# Patient Record
Sex: Female | Born: 1990 | Race: White | Hispanic: No | Marital: Single | Smoking: Never smoker
Health system: Southern US, Community
[De-identification: ages and names within clinical notes are randomized; demographics above are authoritative.]

## PROBLEM LIST (undated history)

## (undated) DIAGNOSIS — G43909 Migraine, unspecified, not intractable, without status migrainosus: Secondary | ICD-10-CM

## (undated) DIAGNOSIS — H9190 Unspecified hearing loss, unspecified ear: Secondary | ICD-10-CM

## (undated) DIAGNOSIS — J45909 Unspecified asthma, uncomplicated: Secondary | ICD-10-CM

## (undated) HISTORY — PX: COCHLEAR IMPLANT: SHX184

## (undated) HISTORY — PX: TONSILLECTOMY: SUR1361

---
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
## 2012-02-28 ENCOUNTER — Encounter (HOSPITAL_BASED_OUTPATIENT_CLINIC_OR_DEPARTMENT_OTHER): Payer: Self-pay | Admitting: *Deleted

## 2012-02-28 ENCOUNTER — Emergency Department (HOSPITAL_BASED_OUTPATIENT_CLINIC_OR_DEPARTMENT_OTHER)
Admission: EM | Admit: 2012-02-28 | Discharge: 2012-02-28 | Disposition: A | Discharge status: Home / Self Care | Payer: BC Managed Care – PPO | Attending: Emergency Medicine | Admitting: Emergency Medicine

## 2012-02-28 DIAGNOSIS — Z79899 Other long term (current) drug therapy: Secondary | ICD-10-CM | POA: Insufficient documentation

## 2012-02-28 DIAGNOSIS — H9190 Unspecified hearing loss, unspecified ear: Secondary | ICD-10-CM | POA: Insufficient documentation

## 2012-02-28 DIAGNOSIS — G43909 Migraine, unspecified, not intractable, without status migrainosus: Secondary | ICD-10-CM | POA: Insufficient documentation

## 2012-02-28 HISTORY — DX: Migraine, unspecified, not intractable, without status migrainosus: G43.909

## 2012-02-28 MED ORDER — METOCLOPRAMIDE HCL 5 MG/ML IJ SOLN
10.0000 mg | Freq: Once | INTRAMUSCULAR | Status: AC
  Administered 2012-02-28: 10 mg via INTRAVENOUS
  Filled 2012-02-28: qty 2

## 2012-02-28 MED ORDER — DIPHENHYDRAMINE HCL 50 MG/ML IJ SOLN
25.0000 mg | Freq: Once | INTRAMUSCULAR | Status: AC
  Administered 2012-02-28: 25 mg via INTRAVENOUS
  Filled 2012-02-28: qty 1

## 2012-02-28 MED ORDER — DEXAMETHASONE SODIUM PHOSPHATE 10 MG/ML IJ SOLN
10.0000 mg | Freq: Once | INTRAMUSCULAR | Status: AC
  Administered 2012-02-28: 10 mg via INTRAVENOUS
  Filled 2012-02-28: qty 1

## 2012-02-28 NOTE — ED Notes (Signed)
Pt. Will receive meds for headache.  No visual distrubance per Pt. And no vomiting today per Pt.   She reports she did have nausea earlier in the week.

## 2012-02-28 NOTE — ED Notes (Signed)
Headache for over a week. Has seen her MD for same and given Relpax and Flexeril without relief.

## 2012-02-28 NOTE — ED Provider Notes (Addendum)
History     CSN: [id number]  Arrival date & time 02/28/12  1306   First MD Initiated Contact with Patient 02/28/12 1328      Chief Complaint  Patient presents with  . Migraine    (Consider location/radiation/quality/duration/timing/severity/associated sxs/prior treatment) Patient is a 21 y.o. female presenting with migraines. The history is provided by the patient.  Migraine This is a recurrent problem. Episode onset: 1 week ago. The problem occurs constantly. The problem has been gradually improving (Saw her Dr. several days ago and was given Relpax and Flexeril which helped her headaches some but it still persists with pain on the right side). Associated symptoms include headaches. Pertinent negatives include no abdominal pain and no shortness of breath. Nothing aggravates the symptoms. Nothing relieves the symptoms. Treatments tried: relpax and flexeril. The treatment provided mild relief.    Past Medical History  Diagnosis Date  . HOH (hard of hearing)   . Migraine     Past Surgical History  Procedure Date  . Tonsillectomy   . Cochlear implant     No family history on file.  History  Substance Use Topics  . Smoking status: Never Smoker   . Smokeless tobacco: Not on file  . Alcohol Use: No    OB History    Grav Para Term Preterm Abortions TAB SAB Ect Mult Living                  Review of Systems  Constitutional: Negative for fever.  Respiratory: Negative for shortness of breath.   Gastrointestinal: Negative for abdominal pain.  Neurological: Positive for headaches. Negative for facial asymmetry, weakness and numbness.  All other systems reviewed and are negative.    Allergies  Penicillins  Home Medications   Current Outpatient Rx  Name Route Sig Dispense Refill  . CYCLOBENZAPRINE HCL 10 MG PO TABS Oral Take 10 mg by mouth 3 (three) times daily as needed.    Marland Kitchen ELETRIPTAN HYDROBROMIDE 40 MG PO TABS Oral One tablet by mouth at onset of headache. May  repeat in 2 hours if headache persists or recurs. may repeat in 2 hours if necessary    . IBUPROFEN 100 MG PO TABS Oral Take 100 mg by mouth every 6 (six) hours as needed. For pain     . LEVOCETIRIZINE DIHYDROCHLORIDE 5 MG PO TABS Oral Take 5 mg by mouth every evening.      . MULTIVITAL PO Oral Take 1 tablet by mouth daily.        BP 120/67  Pulse 118  Temp 98.7 F (37.1 C) (Oral)  Resp 20  SpO2 100%  Physical Exam  Nursing note and vitals reviewed. Constitutional: She is oriented to person, place, and time. She appears well-developed and well-nourished. No distress.  HENT:  Head: Normocephalic and atraumatic.  Right Ear: Tympanic membrane and ear canal normal.  Left Ear: Tympanic membrane and ear canal normal.  Mouth/Throat: Oropharynx is clear and moist.       Cochlear implant  Eyes: Conjunctivae normal and EOM are normal. Pupils are equal, round, and reactive to light.  Fundoscopic exam:      The right eye shows no papilledema.       The left eye shows no papilledema.  Neck: Normal range of motion. Neck supple.  Cardiovascular: Normal rate, regular rhythm, normal heart sounds and intact distal pulses.  Exam reveals no friction rub.   No murmur heard. Pulmonary/Chest: Effort normal and breath sounds normal. No respiratory distress.  She has no wheezes. She has no rales.  Abdominal: Soft. Bowel sounds are normal. She exhibits no distension. There is no tenderness. There is no rebound and no guarding.  Musculoskeletal: Normal range of motion. She exhibits no edema and no tenderness.       No edema  Lymphadenopathy:    She has no cervical adenopathy.  Neurological: She is alert and oriented to person, place, and time. She has normal strength. No cranial nerve deficit or sensory deficit. Gait normal.       No photophobia  Skin: Skin is warm and dry. No rash noted. No erythema.  Psychiatric: She has a normal mood and affect. Her behavior is normal.    ED Course  Procedures  (including critical care time)  Labs Reviewed - No data to display No results found.   1. Migraine       MDM   Pt with typical migraine HA without sx suggestive of SAH(sudden onset, worst of life, or deficits), infection, or cavernous vein thrombosis.  Normal neuro exam and vital signs. Will give HA cocktail and on re-eval.  2:44 PM Pt feeling much better       Gwyneth Sprout, MD 02/28/12 1444  Gwyneth Sprout, MD 02/28/12 1448

## 2013-03-11 IMAGING — CR DG ABDOMEN ACUTE W/ 1V CHEST
3 series · 3 of 3 positions shown · non-contrast
Comparison: CT dated 02/26/2009.

CLINICAL DATA: Generalized abdominal pain, worse today.

ACUTE ABDOMEN SERIES (ABDOMEN 2 VIEW & CHEST 1 VIEW)

[w chest pa]
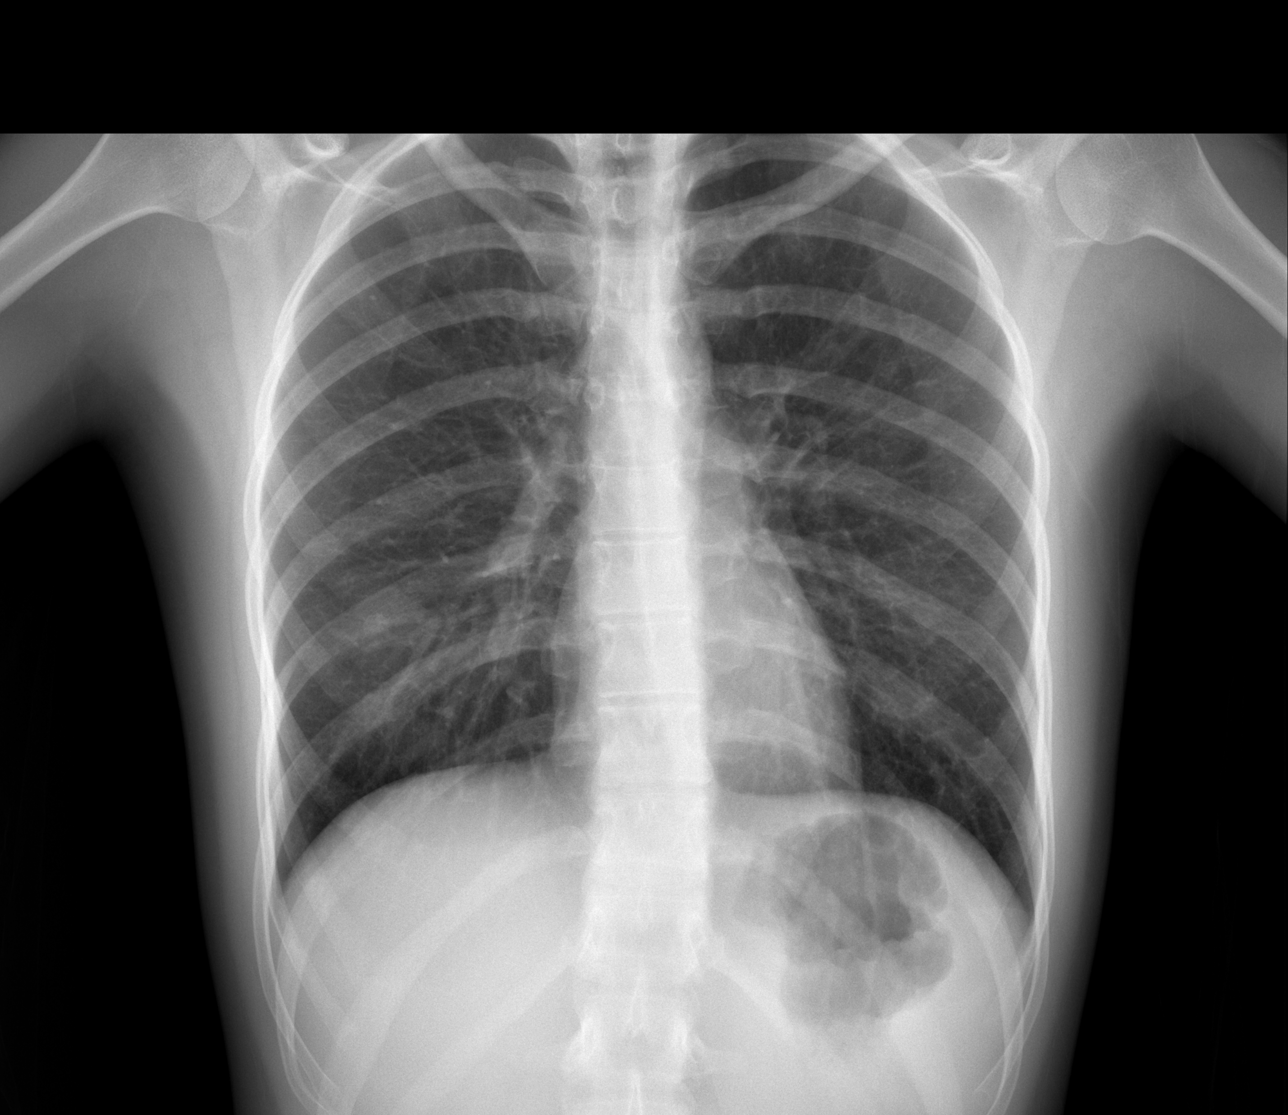

[w abdomen upright]
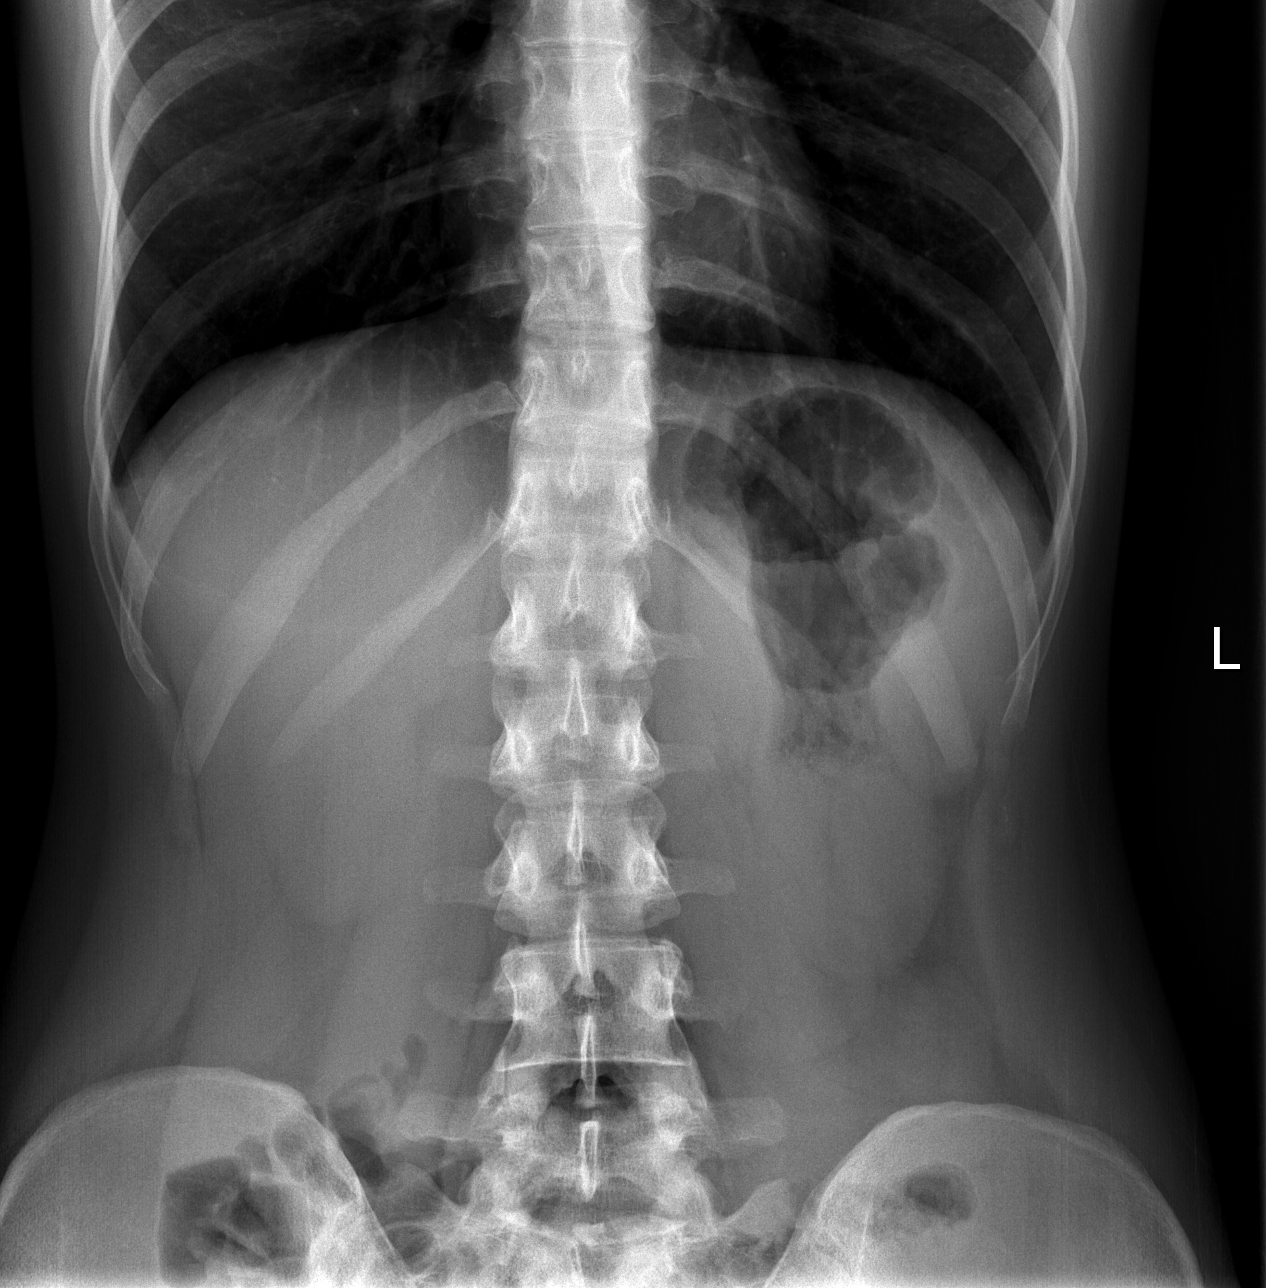

[t abdomen supine]
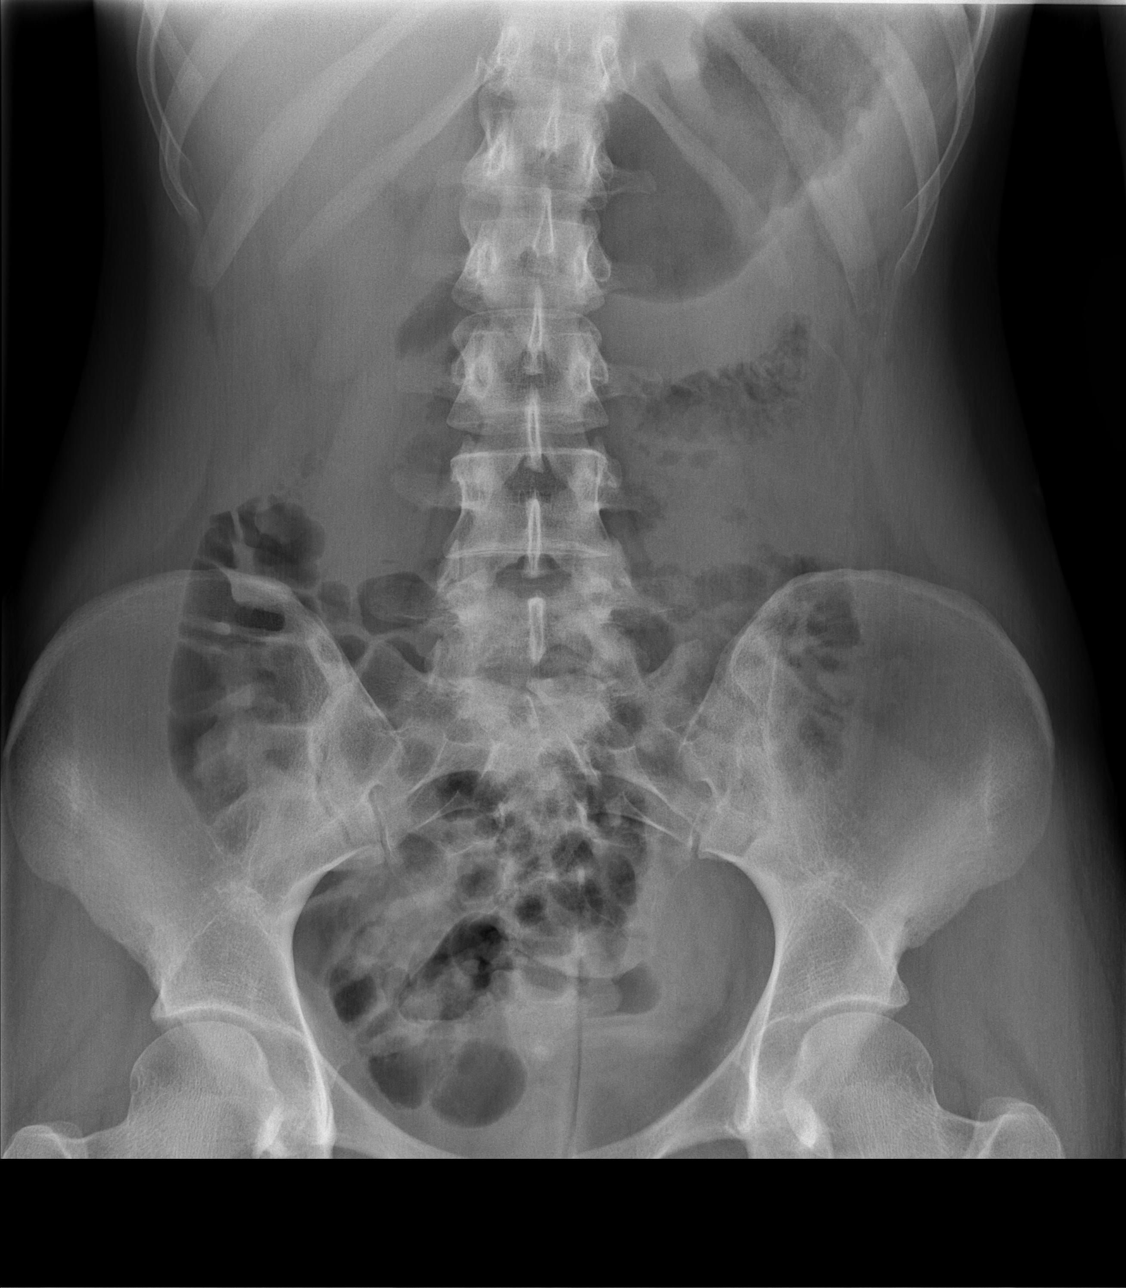

[3 of 3 positions shown; findings below may reference images not displayed]

FINDINGS: Normal sized heart.  Clear lungs.  Right nipple shadow on
the frontal view of the chest, not seen on the upright view of the
abdomen including that area.  Normal bowel gas pattern without free
peritoneal air.  Minimal scoliosis.
IMPRESSION: No acute abnormality.

## 2020-07-06 ENCOUNTER — Emergency Department (HOSPITAL_BASED_OUTPATIENT_CLINIC_OR_DEPARTMENT_OTHER): Payer: BC Managed Care – PPO

## 2020-07-06 ENCOUNTER — Other Ambulatory Visit: Payer: Self-pay

## 2020-07-06 ENCOUNTER — Encounter (HOSPITAL_BASED_OUTPATIENT_CLINIC_OR_DEPARTMENT_OTHER): Payer: Self-pay

## 2020-07-06 ENCOUNTER — Emergency Department (HOSPITAL_BASED_OUTPATIENT_CLINIC_OR_DEPARTMENT_OTHER)
Admission: EM | Admit: 2020-07-06 | Discharge: 2020-07-06 | Disposition: A | Discharge status: Home / Self Care | Payer: BC Managed Care – PPO | Attending: Emergency Medicine | Admitting: Emergency Medicine

## 2020-07-06 DIAGNOSIS — R059 Cough, unspecified: Secondary | ICD-10-CM | POA: Diagnosis present

## 2020-07-06 DIAGNOSIS — R509 Fever, unspecified: Secondary | ICD-10-CM | POA: Diagnosis not present

## 2020-07-06 DIAGNOSIS — J45909 Unspecified asthma, uncomplicated: Secondary | ICD-10-CM | POA: Insufficient documentation

## 2020-07-06 DIAGNOSIS — F419 Anxiety disorder, unspecified: Secondary | ICD-10-CM | POA: Insufficient documentation

## 2020-07-06 HISTORY — DX: Unspecified asthma, uncomplicated: J45.909

## 2020-07-06 MED ORDER — DOXYCYCLINE HYCLATE 100 MG PO TABS
100.0000 mg | ORAL_TABLET | Freq: Once | ORAL | Status: AC
  Administered 2020-07-06: 100 mg via ORAL
  Filled 2020-07-06: qty 1

## 2020-07-06 MED ORDER — HYDROXYZINE HCL 25 MG PO TABS: 25.0000 mg | ORAL_TABLET | Freq: Three times a day (TID) | ORAL | 0 refills | Status: AC | PRN

## 2020-07-06 MED ORDER — DOXYCYCLINE HYCLATE 100 MG PO CAPS: 100.0000 mg | ORAL_CAPSULE | Freq: Two times a day (BID) | ORAL | 0 refills | Status: AC

## 2020-07-06 NOTE — Discharge Instructions (Signed)
Continue taking asthma and allergy medications as prescribed. Take doxycycline as prescribed. Use Atarax as needed for anxiety. Make sure staying well-hydrated water while taking this medicine. If your symptoms do not improve after several days of being on antibiotics, recommend you follow-up with either your primary care doctor or the pulmonologist listed below.  (The pulmonologist group has offices in other locations as well) Return to the emergency room if you develop chest pain, persistent difficulty breathing, persistent blood in your mucus, chest tightness/heaviness not improving, or any new, worsening, or concerning symptoms.

## 2020-07-06 NOTE — ED Notes (Signed)
AVS provided to client, reviewed with client and mother, also provided pt teaching re: to the two (2) Rx by the EDP as well. Opportunity for questions provided.

## 2020-07-06 NOTE — ED Provider Notes (Signed)
[facility] EMERGENCY DEPARTMENT Provider Note   CSN: [id number] Arrival date & time: 07/06/20  1723     History Chief Complaint  Patient presents with  . Cough    Julie Bailey is a 30 y.o. female presenting for evaluation of cough.  Patient states she has had a persistent cough for several months.  For the past 2 days, patient has had low-grade fever and yesterday she had one episode of a tinge of blood in her sputum.  The cough is so bad that is making her anxious, she occasionally has chest pressure with this.  She reports bad allergies and asthma, which is managed by her primary care doctor.  She has been taking all of her home medications as prescribed.  When her symptoms first began, she was put on antibiotics but symptoms never completely cleared.  Her mom is concerned that she has continued infection.  She denies nausea, vomiting, abdominal pain.  She denies recent travel, surgeries, immobilization, history of cancer, recent DVT/PE.  She does take oral birth control. She bas tries prednisone twice, but had significant anxiety and as unable to tolerate it.   HPI     Past Medical History:  Diagnosis Date  . Asthma   . HOH (hard of hearing)   . Migraine     There are no problems to display for this patient.   Past Surgical History:  Procedure Laterality Date  . COCHLEAR IMPLANT    . TONSILLECTOMY       OB History   No obstetric history on file.     History reviewed. No pertinent family history.  Social History   Tobacco Use  . Smoking status: Never Smoker  Substance Use Topics  . Alcohol use: No  . Drug use: No    Home Medications Prior to Admission medications   Medication Sig Start Date End Date Taking? Authorizing Provider  doxycycline (VIBRAMYCIN) 100 MG capsule Take 1 capsule (100 mg total) by mouth 2 (two) times daily. 07/06/20  Yes Ellean Firman, PA-C  hydrOXYzine (ATARAX/VISTARIL) 25 MG tablet Take 1 tablet (25 mg total) by  mouth every 8 (eight) hours as needed for anxiety. 07/06/20  Yes Xochilth Standish, PA-C  cyclobenzaprine (FLEXERIL) 10 MG tablet Take 10 mg by mouth 3 (three) times daily as needed.    [provider]  eletriptan (RELPAX) 40 MG tablet One tablet by mouth at onset of headache. May repeat in 2 hours if headache persists or recurs. may repeat in 2 hours if necessary    [provider]  ibuprofen (ADVIL,MOTRIN) 100 MG tablet Take 100 mg by mouth every 6 (six) hours as needed. For pain     [provider]  levocetirizine (XYZAL) 5 MG tablet Take 5 mg by mouth every evening.      [provider]  Multiple Vitamins-Minerals (MULTIVITAL PO) Take 1 tablet by mouth daily.      [provider]    Allergies    Penicillins  Review of Systems   Review of Systems  Constitutional: Positive for fever.  Respiratory: Positive for cough and chest tightness.   All other systems reviewed and are negative.   Physical Exam Updated Vital Signs BP 137/80 (BP Location: Right Arm)   Pulse 92   Temp 99 F (37.2 C) (Oral)   Resp 20   Ht 5\' 4"  (1.626 m)   Wt 74.8 kg   LMP 06/30/2020   SpO2 100%   BMI 28.32 kg/m  Physical Exam Vitals and nursing note reviewed.  Constitutional:      General: She is not in acute distress.    Appearance: She is well-developed and well-nourished.     Comments: Appears nontoxic  HENT:     Head: Normocephalic and atraumatic.  Eyes:     Extraocular Movements: Extraocular movements intact and EOM normal.     Conjunctiva/sclera: Conjunctivae normal.     Pupils: Pupils are equal, round, and reactive to light.  Cardiovascular:     Rate and Rhythm: Normal rate and regular rhythm.     Pulses: Normal pulses and intact distal pulses.  Pulmonary:     Effort: Pulmonary effort is normal. No respiratory distress.     Breath sounds: Normal breath sounds. No wheezing.  Abdominal:     General: There is no distension.     Palpations: Abdomen  is soft. There is no mass.     Tenderness: There is no abdominal tenderness. There is no guarding or rebound.  Musculoskeletal:        General: Normal range of motion.     Cervical back: Normal range of motion and neck supple.     Right lower leg: No edema.     Left lower leg: No edema.     Comments: No leg pain or swelling  Skin:    General: Skin is warm and dry.     Capillary Refill: Capillary refill takes less than 2 seconds.  Neurological:     Mental Status: She is alert and oriented to person, place, and time.  Psychiatric:        Mood and Affect: Mood and affect normal.     ED Results / Procedures / Treatments   Labs (all labs ordered are listed, but only abnormal results are displayed) Labs Reviewed - No data to display  EKG None  Radiology DG Chest 2 View  Result Date: 07/06/2020 CLINICAL DATA:  Cough for 3 months. EXAM: CHEST - 2 VIEW COMPARISON:  None. FINDINGS: The cardiomediastinal contours are normal. The lungs are clear. Pulmonary vasculature is normal. No consolidation, pleural effusion, or pneumothorax. No acute osseous abnormalities are seen. IMPRESSION: Negative radiographs of the chest. Electronically Signed   By: Narda Rutherford M.D.   On: 07/06/2020 18:24    Procedures Procedures   Medications Ordered in ED Medications  doxycycline (VIBRA-TABS) tablet 100 mg (100 mg Oral Given 07/06/20 2127)    ED Course  I have reviewed the triage vital signs and the nursing notes.  Pertinent labs & imaging results that were available during my care of the patient were reviewed by me and considered in my medical decision making (see chart for details).    MDM Rules/Calculators/A&P                          Patient presenting for evaluation of cough.  On exam, patient appears nontoxic.  Pulmonary exam is overall reassuring, no wheezing or respiratory distress, sat stable on room air.  Temperature high normal at 99.  I discussed with mom and patient that she does have  risk factors for possible PE, in the setting of low-grade fever, persistent cough, blood in the sputum, and patient being on birth control.  I offered work-up including labs and/or imaging, patient mom declined.  Patient is most concerned about infection.  I reviewed the x-ray obtained from triage, does not show signs of acute infection including pneumonia.  However clinically, patient with fever  and cough, such we will treat with antibiotics.  I discussed importance of close follow-up, if symptoms not improving, patient should have repeat evaluation with consideration for PE.  Patient given information for follow-up with pulmonology as needed if asthma is not well controlled.  At this time, patient appears safe for discharge.  Return precautions given.  Patient and mom state they understand and agree to plan.  Final Clinical Impression(s) / ED Diagnoses Final diagnoses:  Cough    Rx / DC Orders ED Discharge Orders         Ordered    doxycycline (VIBRAMYCIN) 100 MG capsule  2 times daily        07/06/20 2113    hydrOXYzine (ATARAX/VISTARIL) 25 MG tablet  Every 8 hours PRN        07/06/20 2113           Alveria Apley, PA-C 07/06/20 2231    Charlynne Pander, MD 07/06/20 2259

## 2020-07-06 NOTE — ED Triage Notes (Signed)
Pt has had a dry cough x 3 months. Been taking inhalers, mucinex without relief. Has blood tinged sputum starting today. NAD during triage
